# Patient Record
Sex: Female | Born: 1950 | Race: White | Hispanic: No | Marital: Married | Smoking: Never smoker
Health system: Southern US, Community
[De-identification: ages and names within clinical notes are randomized; demographics above are authoritative.]

## PROBLEM LIST (undated history)

## (undated) DIAGNOSIS — E669 Obesity, unspecified: Secondary | ICD-10-CM

## (undated) DIAGNOSIS — E119 Type 2 diabetes mellitus without complications: Secondary | ICD-10-CM

## (undated) DIAGNOSIS — N809 Endometriosis, unspecified: Secondary | ICD-10-CM

## (undated) DIAGNOSIS — E785 Hyperlipidemia, unspecified: Secondary | ICD-10-CM

## (undated) DIAGNOSIS — K8020 Calculus of gallbladder without cholecystitis without obstruction: Secondary | ICD-10-CM

## (undated) HISTORY — DX: Obesity, unspecified: E66.9

## (undated) HISTORY — PX: TOTAL ABDOMINAL HYSTERECTOMY: SHX209

## (undated) HISTORY — DX: Calculus of gallbladder without cholecystitis without obstruction: K80.20

## (undated) HISTORY — DX: Type 2 diabetes mellitus without complications: E11.9

## (undated) HISTORY — DX: Hyperlipidemia, unspecified: E78.5

## (undated) HISTORY — DX: Endometriosis, unspecified: N80.9

## (undated) HISTORY — PX: APPENDECTOMY: SHX54

---

## 2007-07-03 ENCOUNTER — Ambulatory Visit: Payer: Self-pay | Admitting: Obstetrics & Gynecology

## 2009-05-05 ENCOUNTER — Ambulatory Visit: Payer: Self-pay | Admitting: Obstetrics & Gynecology

## 2009-05-05 ENCOUNTER — Encounter: Payer: Self-pay | Admitting: Obstetrics & Gynecology

## 2009-09-25 HISTORY — PX: BREAST CYST ASPIRATION: SHX578

## 2010-11-23 ENCOUNTER — Ambulatory Visit: Payer: Self-pay | Admitting: Obstetrics & Gynecology

## 2010-12-13 ENCOUNTER — Ambulatory Visit: Payer: Self-pay | Admitting: Obstetrics & Gynecology

## 2010-12-28 ENCOUNTER — Ambulatory Visit (INDEPENDENT_AMBULATORY_CARE_PROVIDER_SITE_OTHER): Payer: BC Managed Care – PPO | Admitting: Obstetrics & Gynecology

## 2010-12-28 DIAGNOSIS — Z01419 Encounter for gynecological examination (general) (routine) without abnormal findings: Secondary | ICD-10-CM

## 2010-12-29 NOTE — Assessment & Plan Note (Signed)
NAME:  Michelle Lowe, Michelle Lowe NO.:  [id number]  MEDICAL RECORD NO.:  [id number]           PATIENT TYPE:  LOCATION:  [facility] [place]           FACILITY:  PHYSICIAN:  Allie Bossier, MD             DATE OF BIRTH:  DATE OF SERVICE:  12/28/2010                                 CLINIC NOTE  HISTORY OF PRESENT ILLNESS:  Michelle Lowe is a 60 year old married white G2, P2 who comes here for an annual exam.  Please note she was a prior patient of mine in [place].  She has no particular GYN complaints. Her major complaint today is that of increased stress due to recent tragedy.  Her husband was mowing a pasture and had a tractor accident. During the tractor accident, his pelvis was fractured along with other injuries.  He has been hospitalized extensively and is currently at home and doing well.  However, she is now seeming to suffer from what may sound like PTSD.  PAST MEDICAL HISTORY:  Obesity, elevated lipids, and gallstones (asymptomatic).  MEDICATIONS:  Estradiol 0.5 mg daily, multivitamin, fish oil.  She currently takes Diovan/ __________  on a daily basis.  PAST SURGICAL HISTORY:  She had a TAH RSO at age 29 due to endometriosis and appendectomy.  She had a breast cyst aspiration in November 2011.  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  She has been married for 40 years.  They are rarely sexually active as her husband uses Viagra and she has to use a lubricant.  She has lost 22 pounds in the last several years.  Her family doctor is Dr. Pollie Friar.  ALLERGIES:  __________ which causes her to be "nervous."  No latex allergies.  SOCIAL HISTORY:  She drinks alcohol rarely.  FAMILY HISTORY:  Positive for colon cancer in her father who was diagnosed at age 61.  She denies family history of breast or GYN malignancies.  PHYSICAL EXAMINATION:  GENERAL:  Well-nourished, well-hydrated pleasant white female.  Height 5 feet  6 inches, weight 224 pounds. HEENT:  Normal. BREASTS:  Normal. HEART:  Regular rate and rhythm. LUNGS:  Clear to auscultation bilaterally. ABDOMEN:  Benign. EXTERNAL GENITALIA:  Mild atrophy.  No lesions.  Vaginal cuff appears well rugated and normal discharge.  Bimanual exam reveals no masses, and elicits no tenderness.  ASSESSMENT/PLAN: 1. Annual exam.  I have recommended self-breast and self-vulvar exams.     She knows that she still needs pelvic exams annually but not Pap     smears any longer. 2. Stress/posttraumatic stress disorder.  She declines medications at     this time, but she is willing to seek counseling, which she will     arrange on her own. 3. General health maintenance.  She will continue trying to lose     weight and will get her general medical care by Dr. Pollie Friar.  We are     going to schedule a colonoscopy for her.     Allie Bossier, MD    MCD/MEDQ  D:  12/28/2010  T:  12/29/2010  Job:  [id number]

## 2011-02-07 NOTE — Assessment & Plan Note (Signed)
NAME:  Michelle Lowe, Michelle Lowe NO.:  [id number]   MEDICAL RECORD NO.:  [id number]          PATIENT TYPE:  POB   LOCATION:  [facility] [place]         FACILITY:  [facility]   PHYSICIAN:  Allie Bossier, MD        DATE OF BIRTH:  January 13, 1951   DATE OF SERVICE:                                  CLINIC NOTE   HISTORY:  Ms. Tiznado is a 60 year old married, white, gravida 2, para 2,  who comes here for annual exam.   Of note, she previously saw me in [place] in October, 8, 2008.  Her  only GYN complaint today is that of some dyspareunia due to vaginal  atrophy.  She and husband have sex 2 or 3 times a week and she has some  pain because of the atrophy.   PAST MEDICAL HISTORY:  Moderate obesity, although she has lost 22 pounds  on weight watchers since March, elevated triglycerides, and recent  diagnosis of gallstones.   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  She married for 39  years.  Dr. Pollie Friar in [place], is her family doctor.  She is planning  to have her colonoscopy scheduled in the very near future.   PAST SURGICAL HISTORY:  TAHBSO and appendectomy done at 60 years of age.   ALLERGIES:  DARVOCET causes thought to be nervous.  No known latex  allergies.   SOCIAL HISTORY:  Negative for tobacco and drugs.  She drinks alcohol  rarely.   FAMILY HISTORY:  Negative for breast and GYN cancer, but positive for  colon cancer in her father at age 83.   PHYSICAL EXAMINATION:  VITAL SIGNS:  Weight 221, her height is 5 feet 6  inches, blood pressure 122/80, and pulse 44.  HEENT:  Normal.  HEART:  Regular rate and rhythm.  LUNGS:  Clear to auscultation bilaterally.  BREASTS:  Normal bilaterally.  ABDOMEN.  No hepatosplenomegaly.  EXTERNAL GENITALIA:  Moderate atrophy, cuff with moderate atrophy.  Bimanual exam, no pelvic masses.   ASSESSMENT AND PLAN:  Annual exam.  I did do a Pap smear and  recommended  self breast and self-vulvar exams.  Her mammogram will be scheduled for  today.  Colonoscopy in the near future.  She is committed just trying to  continue losing weight.      Allie Bossier, MD     MCD/MEDQ  D:  05/05/2009  T:  05/05/2009  Job:  [id number]

## 2011-03-17 ENCOUNTER — Other Ambulatory Visit: Payer: Self-pay | Admitting: Family Medicine

## 2011-03-17 DIAGNOSIS — Z1211 Encounter for screening for malignant neoplasm of colon: Secondary | ICD-10-CM

## 2011-04-13 ENCOUNTER — Ambulatory Visit
Admission: RE | Admit: 2011-04-13 | Discharge: 2011-04-13 | Disposition: A | Discharge status: Home / Self Care | Payer: BC Managed Care – PPO | Source: Ambulatory Visit | Attending: Family Medicine | Admitting: Family Medicine

## 2011-04-13 DIAGNOSIS — Z1211 Encounter for screening for malignant neoplasm of colon: Secondary | ICD-10-CM

## 2012-03-25 ENCOUNTER — Encounter: Payer: Self-pay | Admitting: *Deleted

## 2012-03-25 ENCOUNTER — Other Ambulatory Visit: Payer: Self-pay | Admitting: *Deleted

## 2012-03-25 DIAGNOSIS — Z780 Asymptomatic menopausal state: Secondary | ICD-10-CM

## 2012-03-25 MED ORDER — ESTRADIOL 1 MG PO TABS: 1.0000 mg | ORAL_TABLET | Freq: Every day | ORAL | Status: DC

## 2012-03-25 NOTE — Telephone Encounter (Signed)
Pt has appt with Dr Marice Potter July 30 for annual.  RF request for Estradiol sent to [facility] in [facility] per Dr Marice Potter.

## 2012-04-24 ENCOUNTER — Encounter: Payer: Self-pay | Admitting: Obstetrics & Gynecology

## 2012-04-24 ENCOUNTER — Ambulatory Visit (INDEPENDENT_AMBULATORY_CARE_PROVIDER_SITE_OTHER): Payer: BC Managed Care – PPO | Admitting: Obstetrics & Gynecology

## 2012-04-24 VITALS — BP 127/75 | HR 48 | Temp 98.0°F | Resp 16 | Ht 66.0 in | Wt 239.0 lb

## 2012-04-24 DIAGNOSIS — Z0000 Encounter for general adult medical examination without abnormal findings: Secondary | ICD-10-CM

## 2012-04-24 NOTE — Progress Notes (Signed)
Subjective:    Michelle Lowe is a 61 y.o. female who presents for an annual exam. The patient has no complaints today. She needs a refill of her estrogen. The patient is sexually active. GYN screening history: last pap: was normal. The patient wears seatbelts: yes. The patient participates in regular exercise: yes. Has the patient ever been transfused or tattooed?: no. The patient reports that there is not domestic violence in her life.   Menstrual History: OB History    Grav Para Term Preterm Abortions TAB SAB Ect Mult Living   2 2              Menarche age: 38 No LMP recorded. Patient has had a hysterectomy.    The following portions of the patient's history were reviewed and updated as appropriate: allergies, current medications, past family history, past medical history, past social history, past surgical history and problem list.  Review of Systems A comprehensive review of systems was negative. She has been married for 42 years and denies dysparunia. Her mammogram and colonoscopy are UTD.   Objective:    BP 127/75  Pulse 48  Temp 98 F (36.7 C) (Oral)  Resp 16  Ht 5\' 6"  (1.676 m)  Wt 239 lb (108.41 kg)  BMI 38.58 kg/m2  General Appearance:    Alert, cooperative, no distress, appears stated age  Head:    Normocephalic, without obvious abnormality, atraumatic  Eyes:    PERRL, conjunctiva/corneas clear, EOM's intact, fundi    benign, both eyes  Ears:    Normal TM's and external ear canals, both ears  Nose:   Nares normal, septum midline, mucosa normal, no drainage    or sinus tenderness  Throat:   Lips, mucosa, and tongue normal; teeth and gums normal  Neck:   Supple, symmetrical, trachea midline, no adenopathy;    thyroid:  no enlargement/tenderness/nodules; no carotid   bruit or JVD  Back:     Symmetric, no curvature, ROM normal, no CVA tenderness  Lungs:     Clear to auscultation bilaterally, respirations unlabored  Chest Wall:    No tenderness or deformity   Heart:     Regular rate and rhythm, S1 and S2 normal, no murmur, rub   or gallop  Breast Exam:    No tenderness, masses, or nipple abnormality  Abdomen:     Soft, non-tender, bowel sounds active all four quadrants,    no masses, no organomegaly  Genitalia:    Normal female without lesion, discharge or tenderness, normal bimanual exam (no pelvic masses or tenderness)     Extremities:   Extremities normal, atraumatic, no cyanosis or edema  Pulses:   2+ and symmetric all extremities  Skin:   Skin color, texture, turgor normal, no rashes or lesions  Lymph nodes:   Cervical, supraclavicular, and axillary nodes normal  Neurologic:   CNII-XII intact, normal strength, sensation and reflexes    throughout  .    Assessment:    Healthy female exam.    Plan:     I have recommended weight loss.

## 2012-05-24 ENCOUNTER — Other Ambulatory Visit: Payer: Self-pay | Admitting: *Deleted

## 2012-05-24 DIAGNOSIS — Z780 Asymptomatic menopausal state: Secondary | ICD-10-CM

## 2012-05-24 MED ORDER — ESTRADIOL 1 MG PO TABS: 1.0000 mg | ORAL_TABLET | Freq: Every day | ORAL | Status: DC

## 2012-11-30 IMAGING — CT CT VIRTUAL COLONOSCOPY SCREENING
2 of 3 series · 13 of 36 positions shown, 18 images · non-contrast
Comparison: None.

CLINICAL DATA: Screening for colorectal carcinoma

CT VIRTUAL COLONOSCOPY FOR SCREENING
TECHNIQUE: The patient was given a standard low so bowel
preparation with Gastrografin and barium for fluid and stool
tagging respectively.  The quality of the bowel preparation is
moderate.  Automated CO2 insufflation of the colon was performed
prior to image acquisition and colonic distention is moderate.
Image post processing was used to generate a 3D endoluminal fly-
through projection of the colon and to electronically subtract
stool/fluid as appropriate.

[Series 2: supine (id) · axial · 0.76mm/px · z∈[-434,-24]mm · 12 of 381 slices shown, 16 images]
[im 35/381  soft-tissue]
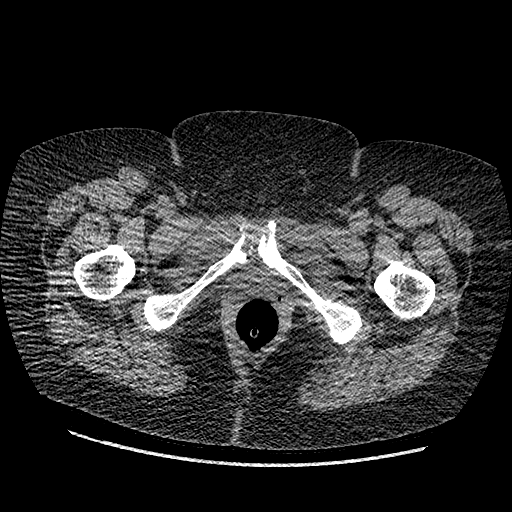
[im 35/381  bone]
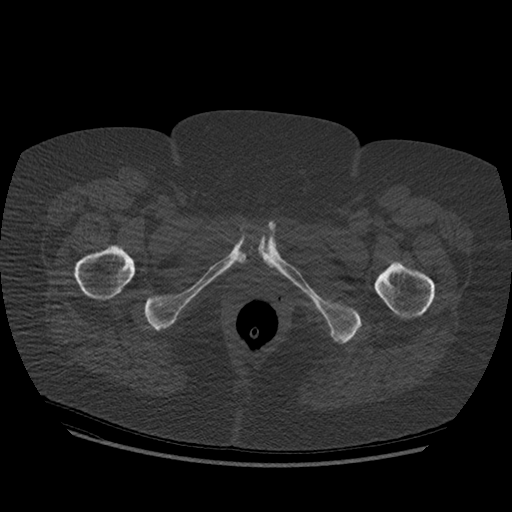
[im 70/381  soft-tissue]
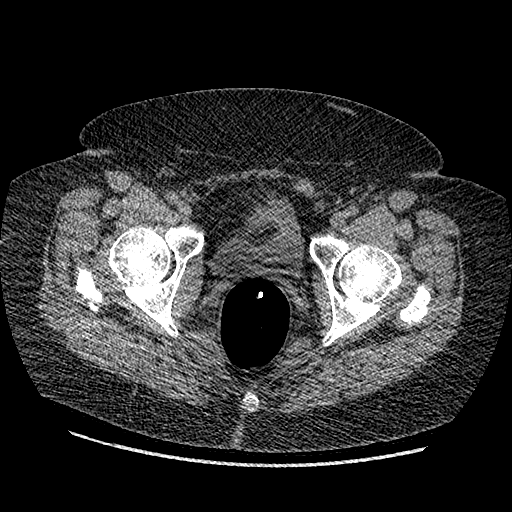
[im 104/381  soft-tissue]
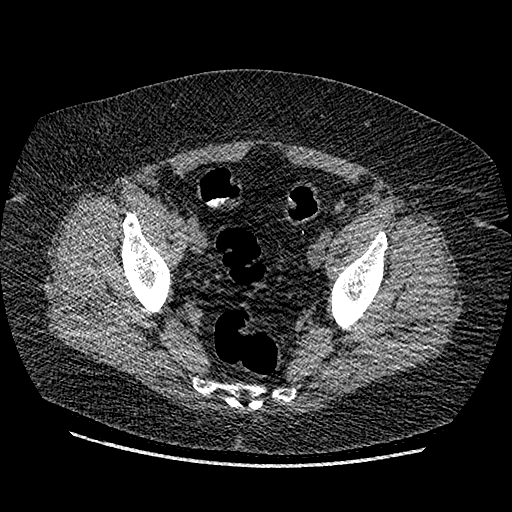
[im 139/381  soft-tissue]
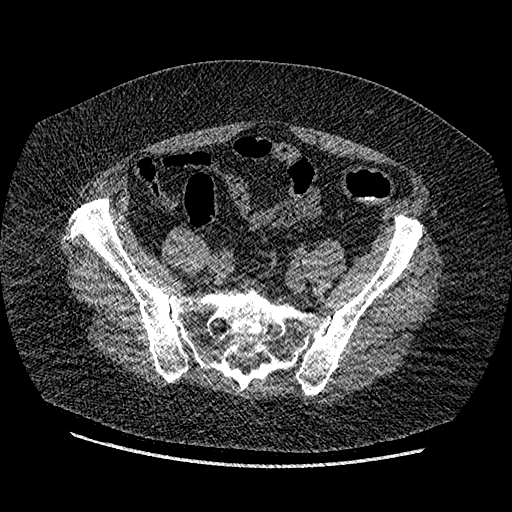
[im 173/381  soft-tissue]
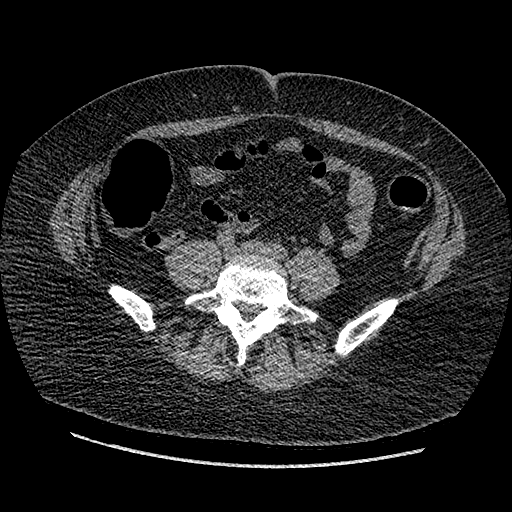
[im 208/381  soft-tissue]
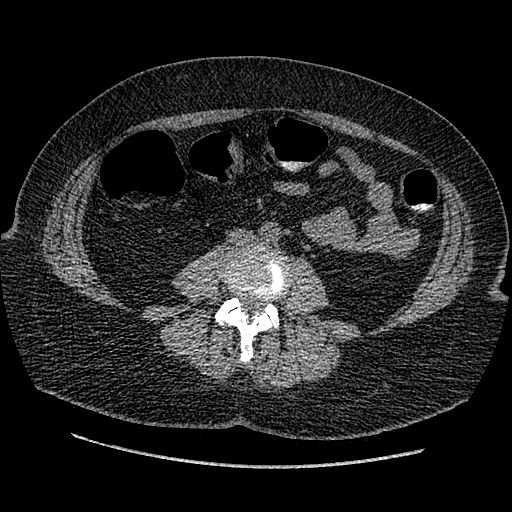
[im 242/381  soft-tissue]
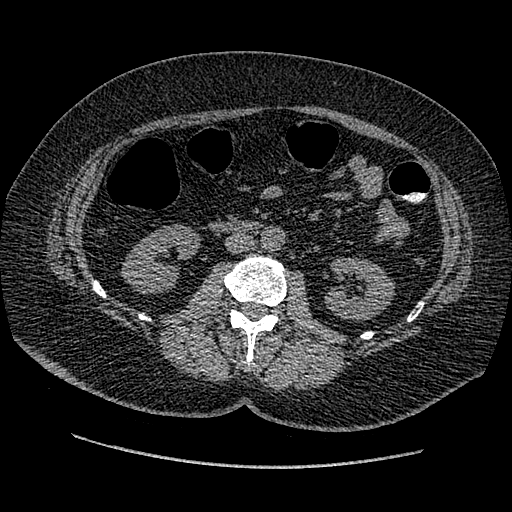
[im 277/381  soft-tissue]
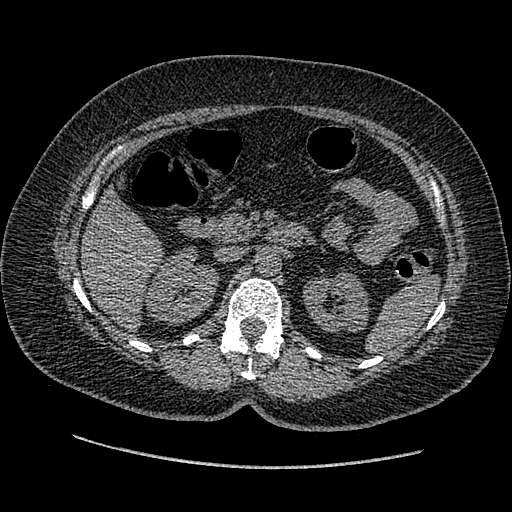
[im 311/381  soft-tissue]
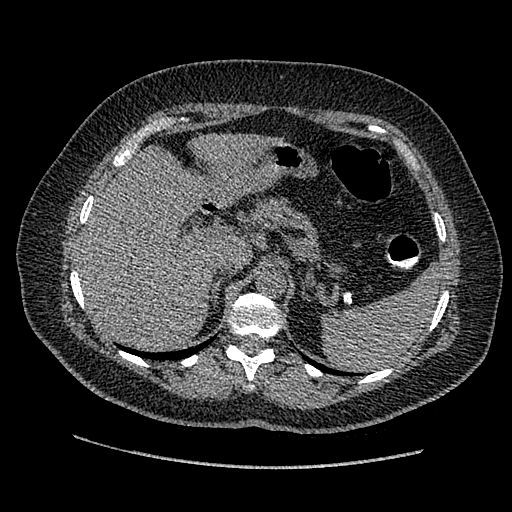
[im 311/381  lung]
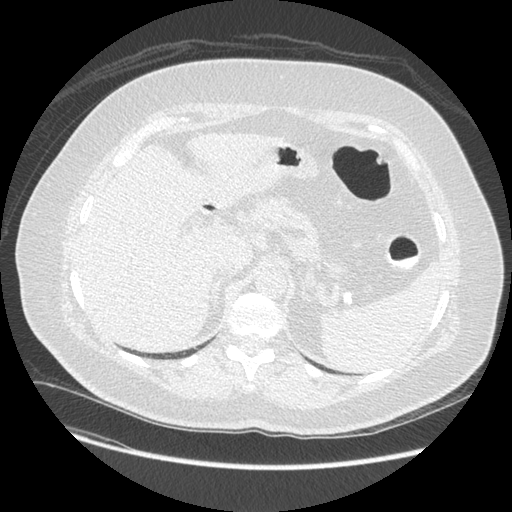
[im 311/381  bone]
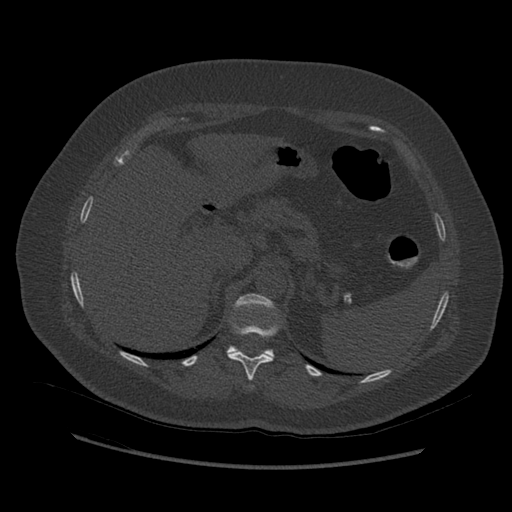
[im 329/381  lung]
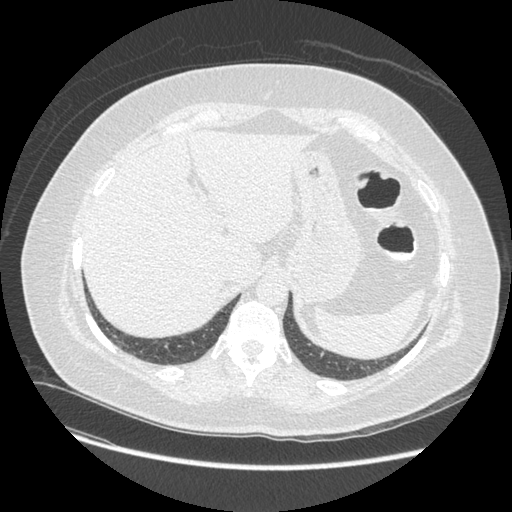
[im 346/381  soft-tissue]
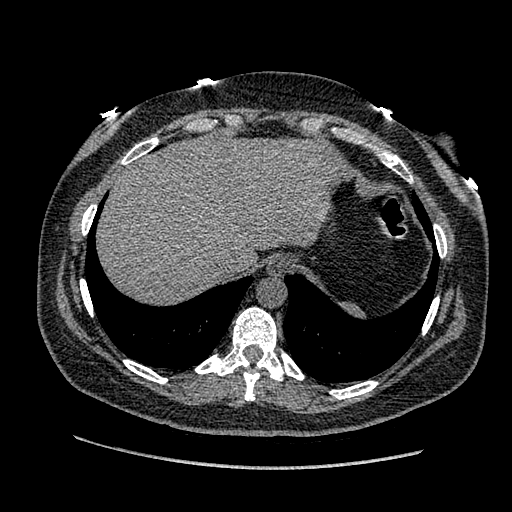
[im 346/381  lung]
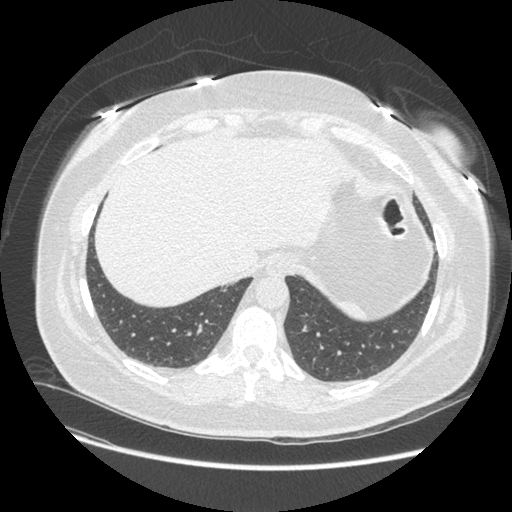
[im 363/381  lung]
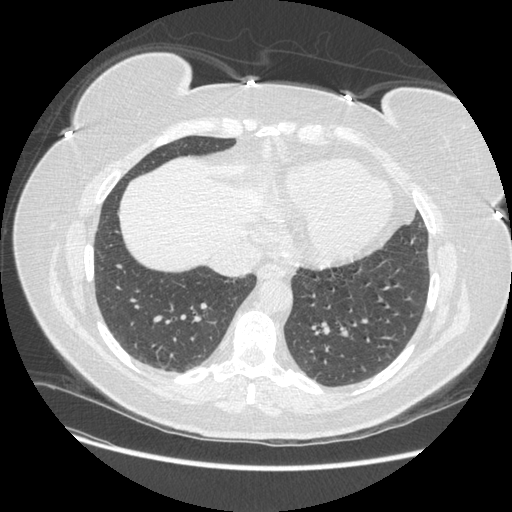

[Series 601: coronal body · coronal · 0.86mm/px · 1 of 129 slices shown, 2 images]
[im 43/129  soft-tissue]
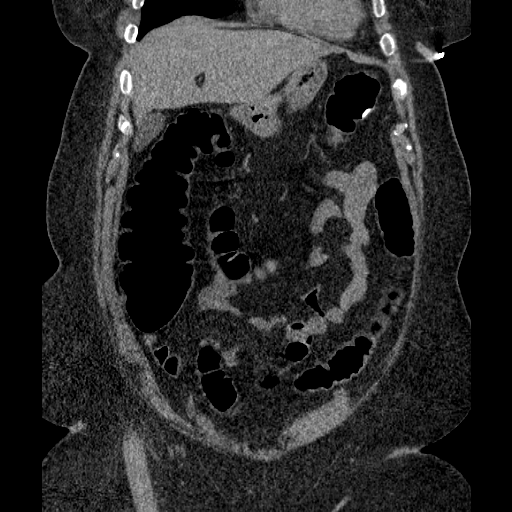
[im 43/129  bone]
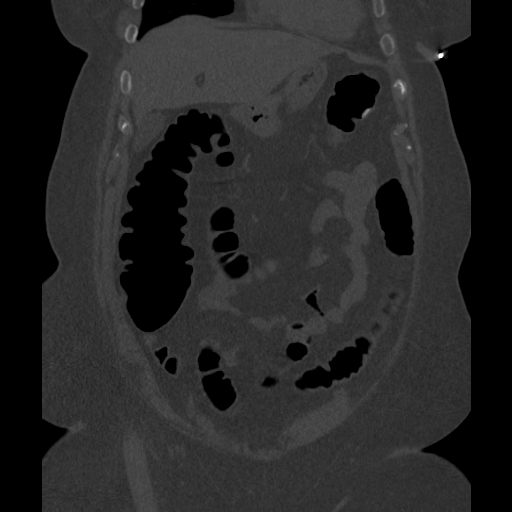

[13 of 36 positions shown; findings below may reference images not displayed]

FINDINGS: There is rectosigmoid colon diverticulosis with
thickening of the mucosa diffusely throughout the rectosigmoid
colon and scattered diverticula.  However, no persistent polypoid
lesion or constricting lesion is seen.  This area, therefore,  is
more difficult to assess in view of the poor distention on both
supine and prone images.  The ileocecal valve is well seen and
appears normal.
IMPRESSION: Rectosigmoid colon diverticula and diverticulosis makes this area
more difficult to assess without adequate distention.  No
clinically significant polypoid lesion is seen. 

Virtual colonoscopy is not designed to detect diminutive polyps
(i.e., less than or equal to 5 mm), the presence or absence of
which may not affect clinical management.

CT ABDOMEN AND PELVIS WITHOUT CONTRAST
FINDINGS: The lung bases are clear.  The heart is mildly enlarged.
The liver is unremarkable on this unenhanced study.  There do
appear to be small faintly calcified gallstones layering in the
neck of the gallbladder.  The pancreas is normal in size and the
pancreatic duct is not dilated.  The adrenal glands and spleen are
unremarkable.  The stomach is not well distended.  No renal calculi
are seen and no renal mass or hydronephrosis is noted.  The
abdominal aorta is normal in caliber.

Rectosigmoid colon diverticulosis again is noted with thickening of
the haustral markings and mucosa and scattered diverticula.  The
urinary bladder is not well there is degenerative disc disease at
the L5-S1 level.  distended.  The uterus has previously been
resected.  No adnexal lesion is seen.  No fluid is noted within the
pelvis.  The appendix has been surgically resected by history.  The
terminal ileum is unremarkable.
IMPRESSION: 1.  Small gallstones in the gallbladder.
2.  Degenerative disc disease at L5-L1.
3.  Sigmoid colon diverticular change and diverticulosis.

## 2012-12-17 ENCOUNTER — Encounter: Payer: Self-pay | Admitting: Obstetrics & Gynecology

## 2013-11-19 ENCOUNTER — Ambulatory Visit: Payer: BC Managed Care – PPO | Admitting: Obstetrics & Gynecology

## 2013-11-19 ENCOUNTER — Encounter: Payer: Self-pay | Admitting: Obstetrics & Gynecology

## 2013-11-19 ENCOUNTER — Ambulatory Visit (INDEPENDENT_AMBULATORY_CARE_PROVIDER_SITE_OTHER): Payer: BC Managed Care – PPO | Admitting: Obstetrics & Gynecology

## 2013-11-19 VITALS — BP 135/77 | HR 56 | Resp 16 | Ht 66.0 in | Wt 244.0 lb

## 2013-11-19 DIAGNOSIS — Z780 Asymptomatic menopausal state: Secondary | ICD-10-CM

## 2013-11-19 DIAGNOSIS — Z01419 Encounter for gynecological examination (general) (routine) without abnormal findings: Secondary | ICD-10-CM

## 2013-11-19 DIAGNOSIS — N951 Menopausal and female climacteric states: Secondary | ICD-10-CM

## 2013-11-19 DIAGNOSIS — Z0000 Encounter for general adult medical examination without abnormal findings: Secondary | ICD-10-CM

## 2013-11-19 MED ORDER — ESTRADIOL 1 MG PO TABS: 1.0000 mg | ORAL_TABLET | Freq: Every day | ORAL | Status: DC

## 2013-11-19 NOTE — Progress Notes (Signed)
Subjective:    Michelle ChromanSandy Lowe is a 63 y.o. female who presents for an annual exam. The patient has no complaints today. She needs a refill of estrace. The patient is sexually active. GYN screening history: last pap: was normal. The patient wears seatbelts: yes. The patient participates in regular exercise: yes. Has the patient ever been transfused or tattooed?: no. The patient reports that there is not domestic violence in her life.   Menstrual History: OB History   Grav Para Term Preterm Abortions TAB SAB Ect Mult Living   2 2              Menarche age: [age]  No LMP recorded. Patient has had a hysterectomy.    The following portions of the patient's history were reviewed and updated as appropriate: allergies, current medications, past family history, past medical history, past social history, past surgical history and problem list.  Review of Systems A comprehensive review of systems was negative.  Married for 44 years. She breaks her 1 mg estrogen estrace in half per day.   Objective:    BP 135/77  Pulse 56  Resp 16  Ht 5\' 6"  (1.676 m)  Wt 244 lb (110.678 kg)  BMI 39.40 kg/m2  General Appearance:    Alert, cooperative, no distress, appears stated age  Head:    Normocephalic, without obvious abnormality, atraumatic  Eyes:    PERRL, conjunctiva/corneas clear, EOM's intact, fundi    benign, both eyes  Ears:    Normal TM's and external ear canals, both ears  Nose:   Nares normal, septum midline, mucosa normal, no drainage    or sinus tenderness  Throat:   Lips, mucosa, and tongue normal; teeth and gums normal  Neck:   Supple, symmetrical, trachea midline, no adenopathy;    thyroid:  no enlargement/tenderness/nodules; no carotid   bruit or JVD  Back:     Symmetric, no curvature, ROM normal, no CVA tenderness  Lungs:     Clear to auscultation bilaterally, respirations unlabored  Chest Wall:    No tenderness or deformity   Heart:    Regular rate and rhythm, S1 and S2 normal, no  murmur, rub   or gallop  Breast Exam:    No tenderness, masses, or nipple abnormality  Abdomen:     Soft, non-tender, bowel sounds active all four quadrants,    no masses, no organomegaly  Genitalia:    Normal female without lesion, discharge or tenderness, no masses or tenderness with bimanual exam     Extremities:   Extremities normal, atraumatic, no cyanosis or edema  Pulses:   2+ and symmetric all extremities  Skin:   Skin color, texture, turgor normal, no rashes or lesions  Lymph nodes:   Cervical, supraclavicular, and axillary nodes normal  Neurologic:   CNII-XII intact, normal strength, sensation and reflexes    throughout  .    Assessment:    Healthy female exam.    Plan:     Breast self exam technique reviewed and patient encouraged to perform self-exam monthly. Mammogram.  Rec SVE monthly

## 2013-11-26 ENCOUNTER — Ambulatory Visit: Payer: BC Managed Care – PPO | Admitting: Obstetrics & Gynecology

## 2014-07-27 ENCOUNTER — Encounter: Payer: Self-pay | Admitting: Obstetrics & Gynecology

## 2014-12-07 ENCOUNTER — Other Ambulatory Visit: Payer: Self-pay | Admitting: Obstetrics & Gynecology

## 2015-01-16 ENCOUNTER — Other Ambulatory Visit: Payer: Self-pay | Admitting: Obstetrics & Gynecology

## 2015-01-22 ENCOUNTER — Other Ambulatory Visit: Payer: Self-pay | Admitting: *Deleted

## 2015-04-23 ENCOUNTER — Encounter: Payer: Self-pay | Admitting: *Deleted

## 2016-03-16 ENCOUNTER — Other Ambulatory Visit: Payer: Self-pay | Admitting: *Deleted

## 2016-03-16 DIAGNOSIS — N952 Postmenopausal atrophic vaginitis: Secondary | ICD-10-CM

## 2016-03-16 MED ORDER — ESTRADIOL 1 MG PO TABS: 1.0000 mg | ORAL_TABLET | Freq: Every day | ORAL | Status: DC

## 2016-03-16 NOTE — Telephone Encounter (Signed)
Rf request for Estradiol 1 mg given until her appt with Dr Marice Potterove in July

## 2016-04-11 ENCOUNTER — Encounter: Payer: Self-pay | Admitting: Obstetrics & Gynecology

## 2016-04-11 ENCOUNTER — Ambulatory Visit (INDEPENDENT_AMBULATORY_CARE_PROVIDER_SITE_OTHER): Payer: Medicare Other | Admitting: Obstetrics & Gynecology

## 2016-04-11 VITALS — BP 151/73 | HR 77 | Resp 16 | Ht 66.0 in | Wt 242.0 lb

## 2016-04-11 DIAGNOSIS — Z01419 Encounter for gynecological examination (general) (routine) without abnormal findings: Secondary | ICD-10-CM

## 2016-04-11 MED ORDER — ESTRADIOL 1 MG PO TABS: 1.0000 mg | ORAL_TABLET | Freq: Every day | ORAL | Status: DC

## 2016-04-11 NOTE — Progress Notes (Signed)
Subjective:    Michelle Lowe is a 65 y.o. MW p3 (daughter 5942 music teacher and 66 yo son) female who presents for an annual exam. The patient has no complaints today. The patient is sexually active. GYN screening history: last pap: was normal. The patient wears seatbelts: yes. The patient participates in regular exercise: yes. Has the patient ever been transfused or tattooed?: no. The patient reports that there is not domestic violence in her life.   Menstrual History: OB History    Gravida Para Term Preterm AB TAB SAB Ectopic Multiple Living   2 2              Menarche age: [age]  No LMP recorded. Patient has had a hysterectomy.    The following portions of the patient's history were reviewed and updated as appropriate: allergies, current medications, past family history, past medical history, past social history, past surgical history and problem list.  Review of Systems Pertinent items are noted in HPI.  Married for 46 years. Denies dyspareunia. 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   Objective:    BP 151/73 mmHg  Pulse 77  Resp 16  Ht 5\' 6"  (1.676 m)  Wt 242 lb (109.77 kg)  BMI 39.08 kg/m2  General Appearance:    Alert, cooperative, no distress, appears stated age  Head:    Normocephalic, without obvious abnormality, atraumatic  Eyes:    PERRL, conjunctiva/corneas clear, EOM's intact, fundi    benign, both eyes  Ears:    Normal TM's and external ear canals, both ears  Nose:   Nares normal, septum midline, mucosa normal, no drainage    or sinus tenderness  Throat:   Lips, mucosa, and tongue normal; teeth and gums normal  Neck:   Supple, symmetrical, trachea midline, no adenopathy;    thyroid:  no enlargement/tenderness/nodules; no carotid   bruit or JVD  Back:     Symmetric, no curvature, ROM normal, no CVA tenderness  Lungs:     Clear to auscultation bilaterally, respirations unlabored  Chest Wall:    No tenderness or deformity   Heart:    Regular rate and rhythm, S1 and  S2 normal, no murmur, rub   or gallop  Breast Exam:    No tenderness, masses, or nipple abnormality  Abdomen:     Soft, non-tender, bowel sounds active all four quadrants,    no masses, no organomegaly  Genitalia:    Normal female without lesion, discharge or tenderness, normal vagina, no masses palpable with bimanual exam     Extremities:   Extremities normal, atraumatic, no cyanosis or edema  Pulses:   2+ and symmetric all extremities  Skin:   Skin color, texture, turgor normal, no rashes or lesions  Lymph nodes:   Cervical, supraclavicular, and axillary nodes normal  Neurologic:   CNII-XII intact, normal strength, sensation and reflexes    throughout  .    Assessment:    Healthy female exam.    Plan:     Mammogram.   Rec increased cardio

## 2017-06-06 ENCOUNTER — Other Ambulatory Visit: Payer: Self-pay

## 2017-06-06 MED ORDER — ESTRADIOL 1 MG PO TABS: 1.0000 mg | ORAL_TABLET | Freq: Every day | ORAL | 1 refills | Status: DC

## 2019-04-16 ENCOUNTER — Other Ambulatory Visit: Payer: Self-pay | Admitting: *Deleted

## 2019-04-16 ENCOUNTER — Other Ambulatory Visit: Payer: Self-pay | Admitting: Obstetrics & Gynecology

## 2019-04-16 MED ORDER — ESTRADIOL 1 MG PO TABS: 1.0000 mg | ORAL_TABLET | Freq: Every day | ORAL | 0 refills | Status: DC

## 2019-04-16 NOTE — Telephone Encounter (Signed)
Pt called for RF on Estradiol.  She states that she has had a pap @ [facility] about 5 months ago.  She has made an appt for her annual with Dr Hulan Fray.  1 RF sent to [facility]

## 2019-04-24 ENCOUNTER — Encounter: Payer: Self-pay | Admitting: Obstetrics & Gynecology

## 2019-04-24 ENCOUNTER — Ambulatory Visit (INDEPENDENT_AMBULATORY_CARE_PROVIDER_SITE_OTHER): Payer: Medicare Other | Admitting: Obstetrics & Gynecology

## 2019-04-24 ENCOUNTER — Other Ambulatory Visit: Payer: Self-pay

## 2019-04-24 VITALS — BP 130/65 | HR 53 | Resp 16 | Ht 66.0 in | Wt 236.0 lb

## 2019-04-24 DIAGNOSIS — Z01419 Encounter for gynecological examination (general) (routine) without abnormal findings: Secondary | ICD-10-CM | POA: Diagnosis not present

## 2019-04-24 MED ORDER — ESTRADIOL 1 MG PO TABS: ORAL_TABLET | ORAL | 6 refills | Status: AC

## 2019-04-24 NOTE — Progress Notes (Signed)
Subjective:    Michelle Lowe is a 68 y.o. married P2 (77 and 69 yo kids and 2 grands that I delivered)  female who presents for an annual exam. The patient has no complaints today. She has been on ERT since she had her uterus, tubes and ovaries removed at age 54. She wants a refill (takes half of the 1 mg tablet daily).  The patient is sexually active. GYN screening history: last pap: was normal. The patient wears seatbelts: yes. The patient participates in regular exercise: yes. Has the patient ever been transfused or tattooed?: no. The patient reports that there is not domestic violence in her life.   Menstrual History: OB History    Gravida  2   Para  2   Term      Preterm      AB      Living        SAB      TAB      Ectopic      Multiple      Live Births              Menarche age: 63 No LMP recorded. Patient has had a hysterectomy.    The following portions of the patient's history were reviewed and updated as appropriate: allergies, current medications, past family history, past medical history, past social history, past surgical history and problem list.  Review of Systems Pertinent items are noted in HPI.   FH- no breast/gyn cancer, + colon cancer in her father (22s) She sees Dr. Garlan Fillers as her fam med doc in [place]. [employer] Retired Garment/textile technologist UTD    Objective:    BP 130/65   Pulse (!) 53   Resp 16   Ht 5\' 6"  (1.676 m)   Wt 236 lb (107 kg)   BMI 38.09 kg/m   General Appearance:    Alert, cooperative, no distress, appears stated age  Head:    Normocephalic, without obvious abnormality, atraumatic  Eyes:    PERRL, conjunctiva/corneas clear, EOM's intact, fundi    benign, both eyes  Ears:    Normal TM's and external ear canals, both ears  Nose:   Nares normal, septum midline, mucosa normal, no drainage    or sinus tenderness  Throat:   Lips, mucosa, and tongue normal; teeth and gums normal  Neck:   Supple, symmetrical, trachea midline, no  adenopathy;    thyroid:  no enlargement/tenderness/nodules; no carotid   bruit or JVD  Back:     Symmetric, no curvature, ROM normal, no CVA tenderness  Lungs:     Clear to auscultation bilaterally, respirations unlabored  Chest Wall:    No tenderness or deformity   Heart:    Regular rate and rhythm, S1 and S2 normal, no murmur, rub   or gallop  Breast Exam:    No tenderness, masses, or nipple abnormality  Abdomen:     Soft, non-tender, bowel sounds active all four quadrants,    no masses, no organomegaly  Genitalia:    Normal female without lesion, discharge or tenderness, minimal atrophy, normal speculum exam and bimanual exam     Extremities:   Extremities normal, atraumatic, no cyanosis or edema  Pulses:   2+ and symmetric all extremities  Skin:   Skin color, texture, turgor normal, no rashes or lesions  Lymph nodes:   Cervical, supraclavicular, and axillary nodes normal  Neurologic:   CNII-XII intact, normal strength, sensation and reflexes    throughout  .  Assessment:    Healthy female exam.    Plan:     She may have a refill of her estrace as prescribed for 3 years without an exam.
